# Patient Record
Sex: Female | Born: 1975 | Race: Black or African American | Hispanic: No | Marital: Married | Smoking: Never smoker
Health system: Southern US, Community
[De-identification: ages and names within clinical notes are randomized; demographics above are authoritative.]

## PROBLEM LIST (undated history)

## (undated) DIAGNOSIS — O24419 Gestational diabetes mellitus in pregnancy, unspecified control: Secondary | ICD-10-CM

## (undated) HISTORY — DX: Gestational diabetes mellitus in pregnancy, unspecified control: O24.419

---

## 1997-12-29 ENCOUNTER — Emergency Department (HOSPITAL_COMMUNITY): Admission: EM | Admit: 1997-12-29 | Discharge: 1997-12-29 | Payer: Self-pay | Admitting: Emergency Medicine

## 1999-05-18 ENCOUNTER — Other Ambulatory Visit: Admission: RE | Admit: 1999-05-18 | Discharge: 1999-05-18 | Payer: Self-pay | Admitting: Internal Medicine

## 2000-07-31 ENCOUNTER — Other Ambulatory Visit: Admission: RE | Admit: 2000-07-31 | Discharge: 2000-07-31 | Payer: Self-pay | Admitting: Obstetrics and Gynecology

## 2000-08-15 ENCOUNTER — Ambulatory Visit (HOSPITAL_COMMUNITY): Admission: RE | Admit: 2000-08-15 | Discharge: 2000-08-15 | Payer: Self-pay | Admitting: Obstetrics and Gynecology

## 2000-08-15 ENCOUNTER — Encounter: Payer: Self-pay | Admitting: Obstetrics and Gynecology

## 2000-12-04 ENCOUNTER — Encounter: Payer: Self-pay | Admitting: Obstetrics and Gynecology

## 2000-12-04 ENCOUNTER — Ambulatory Visit (HOSPITAL_COMMUNITY): Admission: RE | Admit: 2000-12-04 | Discharge: 2000-12-04 | Payer: Self-pay | Admitting: Obstetrics and Gynecology

## 2000-12-21 ENCOUNTER — Inpatient Hospital Stay (HOSPITAL_COMMUNITY): Admission: AD | Admit: 2000-12-21 | Discharge: 2000-12-23 | Payer: Self-pay | Admitting: Obstetrics and Gynecology

## 2001-07-16 ENCOUNTER — Other Ambulatory Visit: Admission: RE | Admit: 2001-07-16 | Discharge: 2001-07-16 | Payer: Self-pay | Admitting: Obstetrics and Gynecology

## 2003-02-17 ENCOUNTER — Other Ambulatory Visit: Admission: RE | Admit: 2003-02-17 | Discharge: 2003-02-17 | Payer: Self-pay | Admitting: Obstetrics and Gynecology

## 2004-10-03 ENCOUNTER — Other Ambulatory Visit: Admission: RE | Admit: 2004-10-03 | Discharge: 2004-10-03 | Payer: Self-pay | Admitting: Obstetrics and Gynecology

## 2006-02-14 ENCOUNTER — Emergency Department (HOSPITAL_COMMUNITY): Admission: EM | Admit: 2006-02-14 | Discharge: 2006-02-14 | Payer: Self-pay | Admitting: Emergency Medicine

## 2011-02-20 ENCOUNTER — Ambulatory Visit: Payer: Self-pay

## 2011-02-20 DIAGNOSIS — Z0289 Encounter for other administrative examinations: Secondary | ICD-10-CM

## 2011-02-22 ENCOUNTER — Encounter (INDEPENDENT_AMBULATORY_CARE_PROVIDER_SITE_OTHER): Payer: Self-pay

## 2011-02-22 DIAGNOSIS — Z111 Encounter for screening for respiratory tuberculosis: Secondary | ICD-10-CM

## 2012-10-29 ENCOUNTER — Ambulatory Visit: Payer: BC Managed Care – PPO | Admitting: Physician Assistant

## 2012-10-29 VITALS — BP 106/76 | HR 96 | Temp 99.5°F | Resp 20 | Ht 62.5 in | Wt 137.2 lb

## 2012-10-29 DIAGNOSIS — R069 Unspecified abnormalities of breathing: Secondary | ICD-10-CM

## 2012-10-29 DIAGNOSIS — R05 Cough: Secondary | ICD-10-CM

## 2012-10-29 MED ORDER — MUCINEX DM MAXIMUM STRENGTH 60-1200 MG PO TB12: 1.0000 | ORAL_TABLET | Freq: Two times a day (BID) | ORAL | Status: DC

## 2012-10-29 MED ORDER — HYDROCOD POLST-CHLORPHEN POLST 10-8 MG/5ML PO LQCR: 5.0000 mL | Freq: Two times a day (BID) | ORAL | Status: AC

## 2012-10-29 NOTE — Progress Notes (Signed)
   [address], [place] [address]   Phone [phone]  Subjective:    Patient ID: Alexa [name], female    DOB: Jul 28, 1975, 37 y.o.   MRN: [id number]  HPI Pt presents to clinic with about 1 wk h/o cold symptoms.  Her congestion has improved she thinks she might have some PND but no rhinorrhea.  Her cough is dry and feels like a tickle in her throat and once it starts she cannot get it to stop.  She is having some chest wall pain related to the cough. The cough is disrupting her sleep.  OTC meds -  Mucinex, zyrtec Sick contacts-none nonsmoker Review of Systems  Constitutional: Negative for fever and chills.  HENT: Positive for sore throat and postnasal drip. Negative for congestion and rhinorrhea.   Respiratory: Positive for cough (mostly dry until small amount of green this am which has not happened again), chest tightness (chest wall pain) and shortness of breath (with coughing spells). Negative for wheezing (no h/o asthma).   Musculoskeletal: Negative for myalgias.  Neurological: Positive for headaches.  Psychiatric/Behavioral: Positive for sleep disturbance (2nd to cough).       Objective:   Physical Exam  Vitals reviewed. Constitutional: She appears well-developed and well-nourished.  HENT:  Head: Normocephalic and atraumatic.  Right Ear: Hearing, tympanic membrane, external ear and ear canal normal.  Left Ear: Hearing, tympanic membrane, external ear and ear canal normal.  Nose: Mucosal edema (red and swollen) present.  Mouth/Throat: Uvula is midline, oropharynx is clear and moist and mucous membranes are normal.  Eyes: Conjunctivae are normal.  Neck: Normal range of motion.  Cardiovascular: Normal rate, regular rhythm and normal heart sounds.   No murmur heard. Pulmonary/Chest: Effort normal and breath sounds normal. No respiratory distress.  Lymphadenopathy:    She has no cervical adenopathy.       Assessment & Plan:  Cough - Plan:  chlorpheniramine-HYDROcodone (TUSSIONEX PENNKINETIC ER) 10-8 MG/5ML LQCR, Dextromethorphan-Guaifenesin (MUCINEX DM MAXIMUM STRENGTH) 60-1200 MG TB12  Pt is suffering from a post-viral inflammatory cough and if she is able to control the cough I think that she will fee better.  I think with the congestion that is seen on exam she has a component of PND and treating with mucinex will help improve that.  Benny Lennert PA-C 10/29/2012 12:27 PM

## 2012-11-19 ENCOUNTER — Ambulatory Visit: Payer: BC Managed Care – PPO

## 2012-11-19 ENCOUNTER — Ambulatory Visit: Payer: BC Managed Care – PPO | Admitting: Family Medicine

## 2012-11-19 VITALS — BP 110/70 | HR 85 | Temp 98.7°F | Resp 16 | Ht 62.5 in | Wt 135.6 lb

## 2012-11-19 DIAGNOSIS — R05 Cough: Secondary | ICD-10-CM

## 2012-11-19 DIAGNOSIS — J189 Pneumonia, unspecified organism: Secondary | ICD-10-CM

## 2012-11-19 DIAGNOSIS — R059 Cough, unspecified: Secondary | ICD-10-CM

## 2012-11-19 MED ORDER — AZITHROMYCIN 250 MG PO TABS: ORAL_TABLET | ORAL | Status: DC

## 2012-11-19 MED ORDER — HYDROCOD POLST-CHLORPHEN POLST 10-8 MG/5ML PO LQCR: 5.0000 mL | Freq: Every evening | ORAL | Status: DC | PRN

## 2012-11-19 MED ORDER — BENZONATATE 100 MG PO CAPS: 100.0000 mg | ORAL_CAPSULE | Freq: Three times a day (TID) | ORAL | Status: DC | PRN

## 2012-11-19 NOTE — Progress Notes (Signed)
Subjective:    Patient ID: Alexa [name], female    DOB: 02-Sep-1975, 37 y.o.   MRN: [id number]  This chart was scribed for Meredith Staggers, MD by Greggory Stallion, ED Scribe. This patient's care was started at 1:26 PM.  HPI HPI Comments: Alexa [name] is a 37 y.o. female who presents to the office complaining of cough.  Seen 10/29/12 with h/o 1 week of cold symptoms. Persistent cough at the time. Suspected postviral inflammatory cough. Treated with Mucinex DM and Tussionex.   Pt is still having productive cough of green sputum with only little relief from the medications she was given 1.5 weeks ago. tussionex helped until ran out. She has also tried Delsym with min relief. She also has congestion and rhinorrhea, green in color, that started 1 week ago. She denies fever. Pt denies history of asthma.    History reviewed. No pertinent past medical history. History reviewed. No pertinent past surgical history. No Known Allergies Prior to Admission medications   Medication Sig Start Date End Date Taking? Authorizing Provider  Dextromethorphan-Guaifenesin (MUCINEX DM MAXIMUM STRENGTH) 60-1200 MG TB12 Take 1 tablet by mouth 2 (two) times daily. 10/29/12  Yes Morrell Riddle, PA-C   History   Social History  . Marital Status: Married    Spouse Name: N/A    Number of Children: N/A  . Years of Education: N/A   Occupational History  . Not on file.   Social History Main Topics  . Smoking status: Never Smoker   . Smokeless tobacco: Not on file  . Alcohol Use: No  . Drug Use: No  . Sexual Activity: Yes    Partners: Male   Other Topics Concern  . Not on file   Social History Narrative  . No narrative on file      Review of Systems  Constitutional: Negative for fever.  HENT: Positive for congestion and rhinorrhea.   Respiratory: Positive for cough.        Objective:   Physical Exam  Constitutional: She is oriented to person, place, and time. She appears well-developed and  well-nourished. No distress.  HENT:  Head: Normocephalic and atraumatic.  Right Ear: Hearing, tympanic membrane, external ear and ear canal normal.  Left Ear: Hearing, tympanic membrane, external ear and ear canal normal.  Nose: Nose normal. Right sinus exhibits no maxillary sinus tenderness and no frontal sinus tenderness. Left sinus exhibits no maxillary sinus tenderness and no frontal sinus tenderness.  Mouth/Throat: Oropharynx is clear and moist. No oropharyngeal exudate.  Eyes: Conjunctivae and EOM are normal. Pupils are equal, round, and reactive to light.  Neck:  No lymphadenopathy.   Cardiovascular: Normal rate, regular rhythm, normal heart sounds and intact distal pulses.   No murmur heard. Pulmonary/Chest: Effort normal. No stridor. No respiratory distress. She has no wheezes. She has rhonchi (left lower lobe).  A few coarse breath sounds in left greater than right lower lobe  Neurological: She is alert and oriented to person, place, and time.  Skin: Skin is warm and dry. No rash noted.  Psychiatric: She has a normal mood and affect. Her behavior is normal.    Filed Vitals:   11/19/12 1306  BP: 110/70  Pulse: 85  Temp: 98.7 F (37.1 C)  TempSrc: Oral  Resp: 16  Height: 5' 2.5" (1.588 m)  Weight: 135 lb 9.6 oz (61.508 kg)  SpO2: 99%   [facility] reading (PRIMARY) by  Dr. Neva Seat: CXR: few increased RML markings without discrete infiltrate.      Marland Kitchen  Assessment & Plan:   Alexa [name] is a 37 y.o. female Cough - Plan: DG Chest 2 View, chlorpheniramine-HYDROcodone (TUSSIONEX PENNKINETIC ER) 10-8 MG/5ML LQCR  CAP (community acquired pneumonia) - Plan: azithromycin (ZITHROMAX) 250 MG tablet   Persistent now discolored/productive cough. Early CAP vs bronchitis. mucinex or mucinex DM or tessalon during the day.  Start Zpak, tussionex qhs prn. rtc precautions.  Meds ordered this encounter  Medications  . azithromycin (ZITHROMAX) 250 MG tablet    Sig: Take 2 pills by mouth on  day 1, then 1 pill by mouth per day on days 2 through 5.    Dispense:  6 each    Refill:  0  . chlorpheniramine-HYDROcodone (TUSSIONEX PENNKINETIC ER) 10-8 MG/5ML LQCR    Sig: Take 5 mLs by mouth at bedtime as needed.    Dispense:  480 mL    Refill:  0   Patient Instructions  Start antibiotic for possible early pneumonia or bronchitis. mucinex during the day, or tessalon if needed during the day for cough, tussionex if needed at night. Return to the clinic or go to the nearest emergency room if any of your symptoms worsen or new symptoms occur. Pneumonia, Adult Pneumonia is an infection of the lungs.  CAUSES Pneumonia may be caused by bacteria or a virus. Usually, these infections are caused by breathing infectious particles into the lungs (respiratory tract). SYMPTOMS   Cough.  Fever.  Chest pain.  Increased rate of breathing.  Wheezing.  Mucus production. DIAGNOSIS  If you have the common symptoms of pneumonia, your caregiver will typically confirm the diagnosis with a chest X-ray. The X-ray will show an abnormality in the lung (pulmonary infiltrate) if you have pneumonia. Other tests of your blood, urine, or sputum may be done to find the specific cause of your pneumonia. Your caregiver may also do tests (blood gases or pulse oximetry) to see how well your lungs are working. TREATMENT  Some forms of pneumonia may be spread to other people when you cough or sneeze. You may be asked to wear a mask before and during your exam. Pneumonia that is caused by bacteria is treated with antibiotic medicine. Pneumonia that is caused by the influenza virus may be treated with an antiviral medicine. Most other viral infections must run their course. These infections will not respond to antibiotics.  PREVENTION A pneumococcal shot (vaccine) is available to prevent a common bacterial cause of pneumonia. This is usually suggested for:  People over 42 years old.  Patients on  chemotherapy.  People with chronic lung problems, such as bronchitis or emphysema.  People with immune system problems. If you are over 65 or have a high risk condition, you may receive the pneumococcal vaccine if you have not received it before. In some countries, a routine influenza vaccine is also recommended. This vaccine can help prevent some cases of pneumonia.You may be offered the influenza vaccine as part of your care. If you smoke, it is time to quit. You may receive instructions on how to stop smoking. Your caregiver can provide medicines and counseling to help you quit. HOME CARE INSTRUCTIONS   Cough suppressants may be used if you are losing too much rest. However, coughing protects you by clearing your lungs. You should avoid using cough suppressants if you can.  Your caregiver may have prescribed medicine if he or she thinks your pneumonia is caused by a bacteria or influenza. Finish your medicine even if you start to feel better.  Your caregiver may also prescribe an expectorant. This loosens the mucus to be coughed up.  Only take over-the-counter or prescription medicines for pain, discomfort, or fever as directed by your caregiver.  Do not smoke. Smoking is a common cause of bronchitis and can contribute to pneumonia. If you are a smoker and continue to smoke, your cough may last several weeks after your pneumonia has cleared.  A cold steam vaporizer or humidifier in your room or home may help loosen mucus.  Coughing is often worse at night. Sleeping in a semi-upright position in a recliner or using a couple pillows under your head will help with this.  Get rest as you feel it is needed. Your body will usually let you know when you need to rest. SEEK IMMEDIATE MEDICAL CARE IF:   Your illness becomes worse. This is especially true if you are elderly or weakened from any other disease.  You cannot control your cough with suppressants and are losing sleep.  You begin  coughing up blood.  You develop pain which is getting worse or is uncontrolled with medicines.  You have a fever.  Any of the symptoms which initially brought you in for treatment are getting worse rather than better.  You develop shortness of breath or chest pain. MAKE SURE YOU:   Understand these instructions.  Will watch your condition.  Will get help right away if you are not doing well or get worse. Document Released: 01/23/2005 Document Revised: 04/17/2011 Document Reviewed: 04/14/2010 [facility] Patient Information 2014 [place], [place]. Cough, Adult  A cough is a reflex that helps clear your throat and airways. It can help heal the body or may be a reaction to an irritated airway. A cough may only last 2 or 3 weeks (acute) or may last more than 8 weeks (chronic).  CAUSES Acute cough:  Viral or bacterial infections. Chronic cough:  Infections.  Allergies.  Asthma.  Post-nasal drip.  Smoking.  Heartburn or acid reflux.  Some medicines.  Chronic lung problems (COPD).  Cancer. SYMPTOMS   Cough.  Fever.  Chest pain.  Increased breathing rate.  High-pitched whistling sound when breathing (wheezing).  Colored mucus that you cough up (sputum). TREATMENT   A bacterial cough may be treated with antibiotic medicine.  A viral cough must run its course and will not respond to antibiotics.  Your caregiver may recommend other treatments if you have a chronic cough. HOME CARE INSTRUCTIONS   Only take over-the-counter or prescription medicines for pain, discomfort, or fever as directed by your caregiver. Use cough suppressants only as directed by your caregiver.  Use a cold steam vaporizer or humidifier in your bedroom or home to help loosen secretions.  Sleep in a semi-upright position if your cough is worse at night.  Rest as needed.  Stop smoking if you smoke. SEEK IMMEDIATE MEDICAL CARE IF:   You have pus in your sputum.  Your cough starts to  worsen.  You cannot control your cough with suppressants and are losing sleep.  You begin coughing up blood.  You have difficulty breathing.  You develop pain which is getting worse or is uncontrolled with medicine.  You have a fever. MAKE SURE YOU:   Understand these instructions.  Will watch your condition.  Will get help right away if you are not doing well or get worse. Document Released: 07/22/2010 Document Revised: 04/17/2011 Document Reviewed: 07/22/2010 [facility] Patient Information 2014 [place], [place].

## 2012-11-19 NOTE — Patient Instructions (Signed)
Start antibiotic for possible early pneumonia or bronchitis. mucinex during the day, or tessalon if needed during the day for cough, tussionex if needed at night. Return to the clinic or go to the nearest emergency room if any of your symptoms worsen or new symptoms occur. Pneumonia, Adult Pneumonia is an infection of the lungs.  CAUSES Pneumonia may be caused by bacteria or a virus. Usually, these infections are caused by breathing infectious particles into the lungs (respiratory tract). SYMPTOMS   Cough.  Fever.  Chest pain.  Increased rate of breathing.  Wheezing.  Mucus production. DIAGNOSIS  If you have the common symptoms of pneumonia, your caregiver will typically confirm the diagnosis with a chest X-ray. The X-ray will show an abnormality in the lung (pulmonary infiltrate) if you have pneumonia. Other tests of your blood, urine, or sputum may be done to find the specific cause of your pneumonia. Your caregiver may also do tests (blood gases or pulse oximetry) to see how well your lungs are working. TREATMENT  Some forms of pneumonia may be spread to other people when you cough or sneeze. You may be asked to wear a mask before and during your exam. Pneumonia that is caused by bacteria is treated with antibiotic medicine. Pneumonia that is caused by the influenza virus may be treated with an antiviral medicine. Most other viral infections must run their course. These infections will not respond to antibiotics.  PREVENTION A pneumococcal shot (vaccine) is available to prevent a common bacterial cause of pneumonia. This is usually suggested for:  People over 67 years old.  Patients on chemotherapy.  People with chronic lung problems, such as bronchitis or emphysema.  People with immune system problems. If you are over 65 or have a high risk condition, you may receive the pneumococcal vaccine if you have not received it before. In some countries, a routine influenza vaccine is also  recommended. This vaccine can help prevent some cases of pneumonia.You may be offered the influenza vaccine as part of your care. If you smoke, it is time to quit. You may receive instructions on how to stop smoking. Your caregiver can provide medicines and counseling to help you quit. HOME CARE INSTRUCTIONS   Cough suppressants may be used if you are losing too much rest. However, coughing protects you by clearing your lungs. You should avoid using cough suppressants if you can.  Your caregiver may have prescribed medicine if he or she thinks your pneumonia is caused by a bacteria or influenza. Finish your medicine even if you start to feel better.  Your caregiver may also prescribe an expectorant. This loosens the mucus to be coughed up.  Only take over-the-counter or prescription medicines for pain, discomfort, or fever as directed by your caregiver.  Do not smoke. Smoking is a common cause of bronchitis and can contribute to pneumonia. If you are a smoker and continue to smoke, your cough may last several weeks after your pneumonia has cleared.  A cold steam vaporizer or humidifier in your room or home may help loosen mucus.  Coughing is often worse at night. Sleeping in a semi-upright position in a recliner or using a couple pillows under your head will help with this.  Get rest as you feel it is needed. Your body will usually let you know when you need to rest. SEEK IMMEDIATE MEDICAL CARE IF:   Your illness becomes worse. This is especially true if you are elderly or weakened from any other disease.  You cannot control your cough with suppressants and are losing sleep.  You begin coughing up blood.  You develop pain which is getting worse or is uncontrolled with medicines.  You have a fever.  Any of the symptoms which initially brought you in for treatment are getting worse rather than better.  You develop shortness of breath or chest pain. MAKE SURE YOU:   Understand these  instructions.  Will watch your condition.  Will get help right away if you are not doing well or get worse. Document Released: 01/23/2005 Document Revised: 04/17/2011 Document Reviewed: 04/14/2010 [facility] Patient Information 2014 [place], [place]. Cough, Adult  A cough is a reflex that helps clear your throat and airways. It can help heal the body or may be a reaction to an irritated airway. A cough may only last 2 or 3 weeks (acute) or may last more than 8 weeks (chronic).  CAUSES Acute cough:  Viral or bacterial infections. Chronic cough:  Infections.  Allergies.  Asthma.  Post-nasal drip.  Smoking.  Heartburn or acid reflux.  Some medicines.  Chronic lung problems (COPD).  Cancer. SYMPTOMS   Cough.  Fever.  Chest pain.  Increased breathing rate.  High-pitched whistling sound when breathing (wheezing).  Colored mucus that you cough up (sputum). TREATMENT   A bacterial cough may be treated with antibiotic medicine.  A viral cough must run its course and will not respond to antibiotics.  Your caregiver may recommend other treatments if you have a chronic cough. HOME CARE INSTRUCTIONS   Only take over-the-counter or prescription medicines for pain, discomfort, or fever as directed by your caregiver. Use cough suppressants only as directed by your caregiver.  Use a cold steam vaporizer or humidifier in your bedroom or home to help loosen secretions.  Sleep in a semi-upright position if your cough is worse at night.  Rest as needed.  Stop smoking if you smoke. SEEK IMMEDIATE MEDICAL CARE IF:   You have pus in your sputum.  Your cough starts to worsen.  You cannot control your cough with suppressants and are losing sleep.  You begin coughing up blood.  You have difficulty breathing.  You develop pain which is getting worse or is uncontrolled with medicine.  You have a fever. MAKE SURE YOU:   Understand these instructions.  Will watch your  condition.  Will get help right away if you are not doing well or get worse. Document Released: 07/22/2010 Document Revised: 04/17/2011 Document Reviewed: 07/22/2010 [facility] Patient Information 2014 [place], [place].

## 2014-02-06 NOTE — L&D Delivery Note (Signed)
Delivery Note Pt progressed rapidly to complete dilation and pushed great for about 5 minutes.  At 2:43 PM a healthy female was delivered via  (Presentation:LOA).  APGAR:8 ,9 ; weight  pending.   Placenta status: delivered spontaneously.  Cord:  with the following complications:short .    Anesthesia:  epidural Episiotomy:  none Lacerations: none Suture Repair:  none Est. Blood Loss (mL):  200cc  Mom to postpartum.  Baby to Couplet care / Skin to Skin. D/w pt circumcision, pt plans in the office  Anabia Weatherwax W 08/25/2014, 2:57 PM

## 2014-04-07 LAB — OB RESULTS CONSOLE HEPATITIS B SURFACE ANTIGEN: Hepatitis B Surface Ag: NEGATIVE

## 2014-04-07 LAB — OB RESULTS CONSOLE PLATELET COUNT: PLATELETS: 261 10*3/uL

## 2014-04-07 LAB — OB RESULTS CONSOLE ABO/RH: RH TYPE: POSITIVE

## 2014-04-07 LAB — OB RESULTS CONSOLE ANTIBODY SCREEN: ANTIBODY SCREEN: NEGATIVE

## 2014-04-07 LAB — OB RESULTS CONSOLE RPR: RPR: NONREACTIVE

## 2014-04-07 LAB — OB RESULTS CONSOLE HGB/HCT, BLOOD
HCT: 36 %
Hemoglobin: 11.7 g/dL

## 2014-04-07 LAB — OB RESULTS CONSOLE RUBELLA ANTIBODY, IGM: RUBELLA: IMMUNE

## 2014-04-07 LAB — OB RESULTS CONSOLE HIV ANTIBODY (ROUTINE TESTING): HIV: NONREACTIVE

## 2014-06-24 ENCOUNTER — Encounter: Payer: Medicaid Other | Attending: Obstetrics and Gynecology

## 2014-06-24 VITALS — Ht 62.0 in | Wt 165.3 lb

## 2014-06-24 DIAGNOSIS — O24419 Gestational diabetes mellitus in pregnancy, unspecified control: Secondary | ICD-10-CM | POA: Insufficient documentation

## 2014-06-24 DIAGNOSIS — Z713 Dietary counseling and surveillance: Secondary | ICD-10-CM | POA: Insufficient documentation

## 2014-06-25 NOTE — Progress Notes (Signed)
  Patient was seen on 06/24/14 for Gestational Diabetes self-management . The following learning objectives were met by the patient :   States the definition of Gestational Diabetes  States why dietary management is important in controlling blood glucose  Describes the effects of carbohydrates on blood glucose levels  Demonstrates ability to create a balanced meal plan  Demonstrates carbohydrate counting   States when to check blood glucose levels  Demonstrates proper blood glucose monitoring techniques  States the effect of stress and exercise on blood glucose levels  States the importance of limiting caffeine and abstaining from alcohol and smoking  Plan:  Aim for 2 Carb Choices per meal (30 grams) +/- 1 either way for breakfast Aim for 3 Carb Choices per meal (45 grams) +/- 1 either way from lunch and dinner Aim for 1-2 Carbs per snack Begin reading food labels for Total Carbohydrate and sugar grams of foods Consider  increasing your activity level by walking daily as tolerated Begin checking BG before breakfast and 1hours after first bit of breakfast, lunch and dinner after  as directed by MD  Take medication  as directed by MD  Blood glucose monitor given: Accu Chek Nano BG Monitoring Kit Lot # [id number] Exp: 07/07/15 Blood glucose reading: $RemoveBeforeDE'89mg'OGMelQgmMDPcQia$ /dl  Patient instructed to monitor glucose levels: FBS: 60 - <90 1 hour: <140  Patient received the following handouts:  Nutrition Diabetes and Pregnancy  Carbohydrate Counting List  Meal Planning worksheet  Patient will be seen for follow-up as needed.

## 2014-08-07 LAB — OB RESULTS CONSOLE GBS: GBS: NEGATIVE

## 2014-08-25 ENCOUNTER — Inpatient Hospital Stay (HOSPITAL_COMMUNITY): Payer: Medicaid Other | Admitting: Anesthesiology

## 2014-08-25 ENCOUNTER — Inpatient Hospital Stay (HOSPITAL_COMMUNITY)
Admission: AD | Admit: 2014-08-25 | Discharge: 2014-08-27 | DRG: 775 | Disposition: A | Discharge status: Home / Self Care | Payer: Medicaid Other | Source: Ambulatory Visit | Attending: Obstetrics and Gynecology | Admitting: Obstetrics and Gynecology

## 2014-08-25 ENCOUNTER — Encounter (HOSPITAL_COMMUNITY): Payer: Self-pay | Admitting: *Deleted

## 2014-08-25 DIAGNOSIS — I9589 Other hypotension: Secondary | ICD-10-CM | POA: Diagnosis present

## 2014-08-25 DIAGNOSIS — Z3A38 38 weeks gestation of pregnancy: Secondary | ICD-10-CM | POA: Diagnosis present

## 2014-08-25 DIAGNOSIS — O4202 Full-term premature rupture of membranes, onset of labor within 24 hours of rupture: Principal | ICD-10-CM | POA: Diagnosis present

## 2014-08-25 DIAGNOSIS — IMO0001 Reserved for inherently not codable concepts without codable children: Secondary | ICD-10-CM

## 2014-08-25 LAB — CBC
HEMATOCRIT: 36.7 % (ref 36.0–46.0)
Hemoglobin: 12.1 g/dL (ref 12.0–15.0)
MCH: 31.5 pg (ref 26.0–34.0)
MCHC: 33 g/dL (ref 30.0–36.0)
MCV: 95.6 fL (ref 78.0–100.0)
Platelets: 254 10*3/uL (ref 150–400)
RBC: 3.84 MIL/uL — AB (ref 3.87–5.11)
RDW: 14.5 % (ref 11.5–15.5)
WBC: 12.3 10*3/uL — ABNORMAL HIGH (ref 4.0–10.5)

## 2014-08-25 LAB — ABO/RH: ABO/RH(D): O POS

## 2014-08-25 LAB — TYPE AND SCREEN
ABO/RH(D): O POS
ANTIBODY SCREEN: NEGATIVE

## 2014-08-25 MED ORDER — DIBUCAINE 1 % RE OINT
1.0000 "application " | TOPICAL_OINTMENT | RECTAL | Status: DC | PRN
  Filled 2014-08-25: qty 28

## 2014-08-25 MED ORDER — LIDOCAINE HCL (PF) 1 % IJ SOLN
INTRAMUSCULAR | Status: DC | PRN
  Administered 2014-08-25 (×2): 4 mL

## 2014-08-25 MED ORDER — TERBUTALINE SULFATE 1 MG/ML IJ SOLN
0.2500 mg | Freq: Once | INTRAMUSCULAR | Status: DC | PRN
  Filled 2014-08-25: qty 1

## 2014-08-25 MED ORDER — PHENYLEPHRINE 40 MCG/ML (10ML) SYRINGE FOR IV PUSH (FOR BLOOD PRESSURE SUPPORT)
PREFILLED_SYRINGE | INTRAVENOUS | Status: AC
  Filled 2014-08-25: qty 20

## 2014-08-25 MED ORDER — LACTATED RINGERS IV SOLN
INTRAVENOUS | Status: DC
  Administered 2014-08-25: 10:00:00 via INTRAVENOUS

## 2014-08-25 MED ORDER — CITRIC ACID-SODIUM CITRATE 334-500 MG/5ML PO SOLN
30.0000 mL | ORAL | Status: DC | PRN
  Filled 2014-08-25: qty 15

## 2014-08-25 MED ORDER — ONDANSETRON HCL 4 MG/2ML IJ SOLN: 4.0000 mg | Freq: Four times a day (QID) | INTRAMUSCULAR | Status: DC | PRN

## 2014-08-25 MED ORDER — SENNOSIDES-DOCUSATE SODIUM 8.6-50 MG PO TABS
2.0000 | ORAL_TABLET | ORAL | Status: DC
  Administered 2014-08-26 (×2): 2 via ORAL
  Filled 2014-08-25 (×2): qty 2

## 2014-08-25 MED ORDER — PHENYLEPHRINE 40 MCG/ML (10ML) SYRINGE FOR IV PUSH (FOR BLOOD PRESSURE SUPPORT)
80.0000 ug | PREFILLED_SYRINGE | INTRAVENOUS | Status: AC | PRN
  Administered 2014-08-25 (×3): 80 ug via INTRAVENOUS

## 2014-08-25 MED ORDER — LANOLIN HYDROUS EX OINT
TOPICAL_OINTMENT | CUTANEOUS | Status: DC | PRN
Start: 2014-08-25 — End: 2014-08-27

## 2014-08-25 MED ORDER — OXYCODONE-ACETAMINOPHEN 5-325 MG PO TABS: 2.0000 | ORAL_TABLET | ORAL | Status: DC | PRN

## 2014-08-25 MED ORDER — OXYCODONE-ACETAMINOPHEN 5-325 MG PO TABS: 1.0000 | ORAL_TABLET | ORAL | Status: DC | PRN

## 2014-08-25 MED ORDER — ONDANSETRON HCL 4 MG PO TABS: 4.0000 mg | ORAL_TABLET | ORAL | Status: DC | PRN

## 2014-08-25 MED ORDER — OXYTOCIN 40 UNITS IN LACTATED RINGERS INFUSION - SIMPLE MED
1.0000 m[IU]/min | INTRAVENOUS | Status: DC
  Administered 2014-08-25: 2 m[IU]/min via INTRAVENOUS
  Filled 2014-08-25: qty 1000

## 2014-08-25 MED ORDER — BENZOCAINE-MENTHOL 20-0.5 % EX AERO
1.0000 "application " | INHALATION_SPRAY | CUTANEOUS | Status: DC | PRN
  Filled 2014-08-25: qty 56

## 2014-08-25 MED ORDER — FENTANYL 2.5 MCG/ML BUPIVACAINE 1/10 % EPIDURAL INFUSION (WH - ANES)
INTRAMUSCULAR | Status: AC
  Filled 2014-08-25: qty 125

## 2014-08-25 MED ORDER — FENTANYL 2.5 MCG/ML BUPIVACAINE 1/10 % EPIDURAL INFUSION (WH - ANES)
14.0000 mL/h | INTRAMUSCULAR | Status: DC | PRN
  Administered 2014-08-25 (×2): 14 mL/h via EPIDURAL

## 2014-08-25 MED ORDER — ACETAMINOPHEN 325 MG PO TABS: 650.0000 mg | ORAL_TABLET | ORAL | Status: DC | PRN

## 2014-08-25 MED ORDER — ONDANSETRON HCL 4 MG/2ML IJ SOLN
4.0000 mg | INTRAMUSCULAR | Status: DC | PRN
Start: 2014-08-25 — End: 2014-08-27

## 2014-08-25 MED ORDER — TETANUS-DIPHTH-ACELL PERTUSSIS 5-2.5-18.5 LF-MCG/0.5 IM SUSP
0.5000 mL | Freq: Once | INTRAMUSCULAR | Status: DC
  Filled 2014-08-25: qty 0.5

## 2014-08-25 MED ORDER — OXYCODONE-ACETAMINOPHEN 5-325 MG PO TABS
1.0000 | ORAL_TABLET | ORAL | Status: DC | PRN
  Administered 2014-08-26 – 2014-08-27 (×5): 1 via ORAL
  Filled 2014-08-25 (×6): qty 1

## 2014-08-25 MED ORDER — LIDOCAINE HCL (PF) 1 % IJ SOLN: 30.0000 mL | INTRAMUSCULAR | Status: DC | PRN

## 2014-08-25 MED ORDER — DIPHENHYDRAMINE HCL 25 MG PO CAPS: 25.0000 mg | ORAL_CAPSULE | Freq: Four times a day (QID) | ORAL | Status: DC | PRN

## 2014-08-25 MED ORDER — PRENATAL MULTIVITAMIN CH
1.0000 | ORAL_TABLET | Freq: Every day | ORAL | Status: DC
  Administered 2014-08-26 – 2014-08-27 (×2): 1 via ORAL
  Filled 2014-08-25 (×2): qty 1

## 2014-08-25 MED ORDER — IBUPROFEN 600 MG PO TABS
600.0000 mg | ORAL_TABLET | Freq: Four times a day (QID) | ORAL | Status: DC
  Administered 2014-08-25 – 2014-08-27 (×8): 600 mg via ORAL
  Filled 2014-08-25 (×8): qty 1

## 2014-08-25 MED ORDER — OXYTOCIN BOLUS FROM INFUSION
500.0000 mL | INTRAVENOUS | Status: DC
  Administered 2014-08-25: 500 mL via INTRAVENOUS

## 2014-08-25 MED ORDER — DIPHENHYDRAMINE HCL 50 MG/ML IJ SOLN: 12.5000 mg | INTRAMUSCULAR | Status: DC | PRN

## 2014-08-25 MED ORDER — OXYTOCIN 40 UNITS IN LACTATED RINGERS INFUSION - SIMPLE MED
62.5000 mL/h | INTRAVENOUS | Status: DC
  Administered 2014-08-25: 62.5 mL/h via INTRAVENOUS

## 2014-08-25 MED ORDER — LACTATED RINGERS IV SOLN: 500.0000 mL | INTRAVENOUS | Status: DC | PRN

## 2014-08-25 MED ORDER — BUTORPHANOL TARTRATE 1 MG/ML IJ SOLN: 1.0000 mg | INTRAMUSCULAR | Status: DC | PRN

## 2014-08-25 MED ORDER — WITCH HAZEL-GLYCERIN EX PADS: 1.0000 "application " | MEDICATED_PAD | CUTANEOUS | Status: DC | PRN

## 2014-08-25 MED ORDER — SIMETHICONE 80 MG PO CHEW: 80.0000 mg | CHEWABLE_TABLET | ORAL | Status: DC | PRN

## 2014-08-25 MED ORDER — ZOLPIDEM TARTRATE 5 MG PO TABS: 5.0000 mg | ORAL_TABLET | Freq: Every evening | ORAL | Status: DC | PRN

## 2014-08-25 MED ORDER — EPHEDRINE 5 MG/ML INJ
10.0000 mg | INTRAVENOUS | Status: DC | PRN
Start: 2014-08-25 — End: 2014-08-25

## 2014-08-25 NOTE — Anesthesia Preprocedure Evaluation (Signed)
Anesthesia Evaluation  Patient identified by MRN, date of birth, ID band Patient awake    Reviewed: Allergy & Precautions, H&P , NPO status , Patient's Chart, lab work & pertinent test results, reviewed documented beta blocker date and time   Airway Mallampati: II  TM Distance: >3 FB Neck ROM: full    Dental no notable dental hx.    Pulmonary neg pulmonary ROS,  breath sounds clear to auscultation  Pulmonary exam normal       Cardiovascular negative cardio ROS Normal cardiovascular examRhythm:regular Rate:Normal     Neuro/Psych negative neurological ROS  negative psych ROS   GI/Hepatic negative GI ROS, Neg liver ROS,   Endo/Other  negative endocrine ROS  Renal/GU negative Renal ROS  negative genitourinary   Musculoskeletal   Abdominal (+) + obese,   Peds  Hematology negative hematology ROS (+)   Anesthesia Other Findings NPO appropriate, allergies reviewed Denies active cardiac or pulmonary symptoms, METS > 4 No recent congestive cough or symptoms of upper respiratory infection Meds - none    Reproductive/Obstetrics (+) Pregnancy                             Anesthesia Physical Anesthesia Plan  ASA: III  Anesthesia Plan: Epidural   Post-op Pain Management:    Induction:   Airway Management Planned:   Additional Equipment:   Intra-op Plan:   Post-operative Plan:   Informed Consent: I have reviewed the patients History and Physical, chart, labs and discussed the procedure including the risks, benefits and alternatives for the proposed anesthesia with the patient or authorized representative who has indicated his/her understanding and acceptance.     Plan Discussed with:   Anesthesia Plan Comments:         Anesthesia Quick Evaluation

## 2014-08-25 NOTE — MAU Note (Signed)
Pt reports contractions since yesterday, now 4 minutes apart.

## 2014-08-25 NOTE — H&P (Signed)
Antony OdeaRasheeda D Speciale is a 39 y.o. female [phone] at 38+ weeks (EDD 09/05/14 by 16 week US and unsure LMP) presenting for painful regular contractions and cervical change.  Cervix changed from 1-2cm to 4cm in MAU and pt had SROM.  Prenatal care significant for late start at 16 weeks and AMA.  She had a prior preterm delivery at 4136 6/7 weeks after SROM--d/w her 17-P and she declined.    Maternal Medical History:  Reason for admission: Contractions.   Contractions: Onset was 3-5 hours ago.   Frequency: regular.   Perceived severity is strong.    Fetal activity: Perceived fetal activity is normal.    Prenatal Complications - Diabetes: none.    OB History    Gravida Para Term Preterm AB TAB SAB Ectopic Multiple Living   3 2 2       2     1997 NSVD 6#10oz 2002 NSVD 4#13oz  History reviewed. No pertinent past medical history. Past Surgical History  Procedure Laterality Date  . Vaginal delivery     Family History: family history includes Asthma in her son; Cancer in her father. Social History:  reports that she has never smoked. She does not have any smokeless tobacco history on file. She reports that she does not drink alcohol or use illicit drugs.   Prenatal Transfer Tool  Maternal Diabetes: No Genetic Screening: Normal Maternal Ultrasounds/Referrals: Normal Fetal Ultrasounds or other Referrals:  None Maternal Substance Abuse:  No Significant Maternal Medications:  None Significant Maternal Lab Results:  None Other Comments:  None  Review of Systems  Gastrointestinal: Negative for abdominal pain.  Neurological: Negative for headaches.    Dilation: 4 (No cord felt) Effacement (%): 90 Station: -2 Exam by:: S. Carrera, RNC Blood pressure 104/60, pulse 88, temperature 97.7 F (36.5 C), temperature source Oral, resp. rate 20, height 5\' 2"  (1.575 m), weight 77.111 kg (170 lb), SpO2 100 %. Maternal Exam:  Uterine Assessment: Contraction strength is moderate.  Contraction frequency  is regular.   Abdomen: Fetal presentation: vertex  Introitus: Normal vulva. Normal vagina.  Amniotic fluid character: clear.     Physical Exam  Constitutional: She appears well-developed and well-nourished.  Cardiovascular: Normal rate and regular rhythm.   Respiratory: Effort normal and breath sounds normal.  GI: Soft.  Genitourinary: Vagina normal.  Neurological: She is alert.  Psychiatric: She has a normal mood and affect.    Prenatal labs: ABO, Rh: --/--/O POS (07/19 [id number]) Antibody: NEG (07/19 0955) Rubella: Immune (03/01 0000) RPR: Nonreactive (03/01 0000)  HBsAg: Negative (03/01 0000)  HIV: Non-reactive (03/01 0000)  GBS: Negative (07/01 0000)  One hour GCT 158 Three hour GTT WNL Hgb AA CF negative Tetra negative  Assessment/Plan: Pt admitted and received epidural--had decel immediately following epidural for about 10 minutes which required phenylepinephrine x 3 to recover.  Now recovered with some variable decels.  IUPC placed and FSE.  Will follow heart rate closely.   Oliver PilaICHARDSON,Mikaiah Stoffer W 08/25/2014, 12:28 PM

## 2014-08-25 NOTE — Anesthesia Procedure Notes (Signed)
Epidural Patient location during procedure: OB  Staffing Anesthesiologist: Ross Hefferan Performed by: anesthesiologist   Preanesthetic Checklist Completed: patient identified, site marked, surgical consent, pre-op evaluation, timeout performed, IV checked, risks and benefits discussed and monitors and equipment checked  Epidural Patient position: sitting Prep: site prepped and draped and DuraPrep Patient monitoring: continuous pulse ox and blood pressure Approach: midline Injection technique: LOR air  Needle:  Needle type: Tuohy  Needle gauge: 17 G Needle length: 9 cm and 9 Needle insertion depth: 5 cm cm Catheter type: closed end flexible Catheter size: 19 Gauge Catheter at skin depth: 9 cm Test dose: negative  Assessment Events: blood not aspirated, injection not painful, no injection resistance, negative IV test and no paresthesia

## 2014-08-25 NOTE — Progress Notes (Signed)
Patient ID: Antony Odeaasheeda D Resende, female   DOB: [birth year], 39 y.o.   MRN: [id number]  Called to patient's room due to bradycardia x 8 minutes.  Dr Senaida Oresichardson on way in.  SVE 4-5/90%-0.  Bradycardia due to hypotension.  FHT responded to scalp stimulation on SVE.  FHT returned to baseline.  Pt stable on my departure when Dr Senaida Oresichardson arrived to assume care.  Levie HeritageJacob J Jerre Vandrunen, DO 08/25/2014 12:14 PM

## 2014-08-26 LAB — CBC
HCT: 29.4 % — ABNORMAL LOW (ref 36.0–46.0)
Hemoglobin: 9.4 g/dL — ABNORMAL LOW (ref 12.0–15.0)
MCH: 30.5 pg (ref 26.0–34.0)
MCHC: 32 g/dL (ref 30.0–36.0)
MCV: 95.5 fL (ref 78.0–100.0)
Platelets: 233 10*3/uL (ref 150–400)
RBC: 3.08 MIL/uL — AB (ref 3.87–5.11)
RDW: 14.6 % (ref 11.5–15.5)
WBC: 16 10*3/uL — AB (ref 4.0–10.5)

## 2014-08-26 LAB — RPR: RPR Ser Ql: NONREACTIVE

## 2014-08-26 LAB — HIV ANTIBODY (ROUTINE TESTING W REFLEX): HIV Screen 4th Generation wRfx: NONREACTIVE

## 2014-08-26 NOTE — Anesthesia Postprocedure Evaluation (Signed)
Anesthesia Post Note  Patient: Alexa [name]  Procedure(s) Performed: * No procedures listed *  Anesthesia type: Epidural  Patient location: Mother/Baby  Post pain: Pain level controlled  Post assessment: Post-op Vital signs reviewed  Last Vitals:  Filed Vitals:   08/26/14 0603  BP: 117/71  Pulse: 93  Temp: 36.8 C  Resp: 18    Post vital signs: Reviewed  Level of consciousness:alert  Complications: No apparent anesthesia complications

## 2014-08-26 NOTE — Lactation Note (Signed)
This note was copied from the chart of Alexa [name] [place]. Lactation Consultation Note: Mom reports that baby just fed for about 15 min and is asleep in her lap at present. Has been giving some formula also. Reports that breasts are feeling heavier today. Asking about pumping- manual pump given with instructions Mom pumping as I left room. Obtaining whitish milk. Encouraged frequent breast feeding to soften breasts. No questions at present. To call for assist prn  Patient Name: Alexa [name] Mosher ZOXWR'UToday's Date: 08/26/2014 Reason for consult: Initial assessment   Maternal Data Formula Feeding for Exclusion: Yes Reason for exclusion: Mother's choice to formula and breast feed on admission Has patient been taught Hand Expression?: Yes Does the patient have breastfeeding experience prior to this delivery?: Yes  Feeding Feeding Type: Formula  LATCH Score/Interventions                      Lactation Tools Discussed/Used Pump Review: Setup, frequency, and cleaning Initiated by:: DW Date initiated:: 08/27/14   Consult Status      Pamelia HoitWeeks, Saanvika Vazques D 08/26/2014, 3:26 PM

## 2014-08-26 NOTE — Progress Notes (Signed)
UR chart review completed.  

## 2014-08-26 NOTE — Progress Notes (Signed)
Post Partum Day 1 Subjective: no complaints, up ad lib, voiding, tolerating PO and nl lochia, pain controlled  Objective: Blood pressure 117/71, pulse 93, temperature 98.2 F (36.8 C), temperature source Oral, resp. rate 18, height 5\' 2"  (1.575 m), weight 77.111 kg (170 lb), SpO2 100 %, unknown if currently breastfeeding.  Physical Exam:  General: alert and no distress Lochia: appropriate Uterine Fundus: firm   Recent Labs  08/25/14 1006 08/26/14 0543  HGB 12.1 9.4*  HCT 36.7 29.4*    Assessment/Plan: Plan for discharge tomorrow, Breastfeeding and Lactation consult.  Routine care.     LOS: 1 day   [name], Alexa [name] 08/26/2014, 8:09 AM

## 2014-08-27 MED ORDER — PRENATAL VITAMIN 27-0.8 MG PO TABS: 1.0000 | ORAL_TABLET | Freq: Every day | ORAL | Status: DC

## 2014-08-27 MED ORDER — IBUPROFEN 800 MG PO TABS: 800.0000 mg | ORAL_TABLET | Freq: Three times a day (TID) | ORAL | Status: DC | PRN

## 2014-08-27 MED ORDER — OXYCODONE-ACETAMINOPHEN 5-325 MG PO TABS: 1.0000 | ORAL_TABLET | Freq: Four times a day (QID) | ORAL | Status: DC | PRN

## 2014-08-27 NOTE — Lactation Note (Signed)
This note was copied from the chart of Alexa [name]. Lactation Consultation Note: Mom has just finished feeding baby - has been using NS because her "nipples were too big for the baby's mouth" Baby still rooting, offered assist with latch without NS. Baby latched well and mom reports it feels fine. Encouraged to nurse without NS as much as possible. Mom has been giving formula also. Encouraged to always breast feed first to promote a good milk supply. No questions at present. To call prn  Patient Name: Alexa Bebe Moncure ZOXWR'U Date: 08/27/2014 Reason for consult: Follow-up assessment   Maternal Data Formula Feeding for Exclusion: Yes Reason for exclusion: Mother's choice to formula and breast feed on admission Has patient been taught Hand Expression?: Yes Does the patient have breastfeeding experience prior to this delivery?: Yes  Feeding Feeding Type: Breast Fed Length of feed: 10 min  LATCH Score/Interventions Latch: Grasps breast easily, tongue down, lips flanged, rhythmical sucking. Intervention(s):  (breast shield.)  Audible Swallowing: A few with stimulation Intervention(s): Alternate breast massage  Type of Nipple: Everted at rest and after stimulation (has been using NS- able to latch without NS)  Comfort (Breast/Nipple): Soft / non-tender     Hold (Positioning): Assistance needed to correctly position infant at breast and maintain latch. Intervention(s): Breastfeeding basics reviewed  LATCH Score: 8  Lactation Tools Discussed/Used Tools: Nipple Shields Nipple shield size: 24 WIC Program: Yes   Consult Status Consult Status: Complete    Pamelia Hoit 08/27/2014, 10:27 AM

## 2014-08-27 NOTE — Progress Notes (Signed)
Post Partum Day 2 Subjective: no complaints, up ad lib, voiding, tolerating PO and nl lochia, pain controlled  Objective: Blood pressure 120/80, pulse 96, temperature 98.1 F (36.7 C), temperature source Oral, resp. rate 18, height  (1.575 m), weight 77.111 kg (170 lb), SpO2 100 %, unknown if currently breastfeeding.  Physical Exam:  General: alert and no distress Lochia: appropriate Uterine Fundus: firm   Recent Labs  08/25/14 1006 08/26/14 0543  HGB 12.1 9.4*  HCT 36.7 29.4*    Assessment/Plan: Discharge home, Breastfeeding and Lactation consult.  Routine care.  D/c with motrin, percocet and PNV.  F/u 6 weeks.     LOS: 2 days   Bovard-Stuckert, Avana Kreiser 08/27/2014, 8:08 AM

## 2014-08-27 NOTE — Discharge Summary (Signed)
Obstetric Discharge Summary Reason for Admission: onset of labor and rupture of membranes Prenatal Procedures: none Intrapartum Procedures: spontaneous vaginal delivery Postpartum Procedures: none Complications-Operative and Postpartum: none HEMOGLOBIN  Date Value Ref Range Status  08/26/2014 9.4* 12.0 - 15.0 g/dL Final    Comment:    REPEATED TO VERIFY DELTA CHECK NOTED   04/07/2014 11.7 g/dL Final   HCT  Date Value Ref Range Status  08/26/2014 29.4* 36.0 - 46.0 % Final  04/07/2014 36 % Final    Physical Exam:  General: alert and no distress Lochia: appropriate Uterine Fundus: firm  Discharge Diagnoses: Term Pregnancy-delivered  Discharge Information: Date: 08/27/2014 Activity: pelvic rest Diet: routine Medications: PNV, Ibuprofen and Percocet Condition: stable Instructions: refer to practice specific booklet Discharge to: home Follow-up Information    Follow up with Oliver Pila, MD. Schedule an appointment as soon as possible for a visit in 6 weeks.   Specialty:  Obstetrics and Gynecology   Why:  Call and verify circumcision appointment   Contact information:   [address] [place] [address] [phone]       Newborn Data: Live born female  Birth Weight: 6 lb 2.8 oz (2800 g) APGAR: 9, 9  Home with mother.  Bovard-Stuckert, Chellsea Beckers 08/27/2014, 8:18 AM

## 2014-09-01 ENCOUNTER — Inpatient Hospital Stay (HOSPITAL_COMMUNITY): Admission: RE | Admit: 2014-09-01 | Payer: Medicaid Other | Source: Ambulatory Visit

## 2015-12-14 ENCOUNTER — Ambulatory Visit (INDEPENDENT_AMBULATORY_CARE_PROVIDER_SITE_OTHER): Payer: Worker's Compensation | Admitting: Physician Assistant

## 2015-12-14 VITALS — BP 124/80 | HR 90 | Temp 98.2°F | Resp 18 | Ht 62.0 in | Wt 172.0 lb

## 2015-12-14 DIAGNOSIS — W503XXA Accidental bite by another person, initial encounter: Secondary | ICD-10-CM | POA: Diagnosis not present

## 2015-12-14 DIAGNOSIS — Y990 Civilian activity done for income or pay: Secondary | ICD-10-CM

## 2015-12-14 DIAGNOSIS — Z7721 Contact with and (suspected) exposure to potentially hazardous body fluids: Secondary | ICD-10-CM | POA: Diagnosis not present

## 2015-12-14 DIAGNOSIS — Z23 Encounter for immunization: Secondary | ICD-10-CM

## 2015-12-14 NOTE — Patient Instructions (Addendum)
  I will contact you with the results.  Please talk to the principal to see if the student and their family is willing to bring them in to have their blood drawn.   IF you received an x-ray today, you will receive an invoice from [facility]. Please contact [facility] at [phone] with questions or concerns regarding your invoice.   IF you received labwork today, you will receive an invoice from [facility]. Please contact [facility] at [phone] with questions or concerns regarding your invoice.   Our billing staff will not be able to assist you with questions regarding bills from these companies.  You will be contacted with the lab results as soon as they are available. The fastest way to get your results is to activate your My Chart account. Instructions are located on the last page of this paperwork. If you have not heard from us regarding the results in 2 weeks, please contact this office.
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
## 2015-12-15 LAB — HEPATITIS C ANTIBODY: HCV Ab: NEGATIVE

## 2015-12-15 LAB — HIV ANTIBODY (ROUTINE TESTING W REFLEX): HIV 1&2 Ab, 4th Generation: NONREACTIVE

## 2015-12-15 LAB — HEPATITIS B SURFACE ANTIGEN: Hepatitis B Surface Ag: NEGATIVE

## 2015-12-15 LAB — HEPATITIS B SURFACE ANTIBODY, QUANTITATIVE

## 2015-12-16 ENCOUNTER — Encounter: Payer: Self-pay | Admitting: Physician Assistant

## 2015-12-18 ENCOUNTER — Encounter: Payer: Self-pay | Admitting: Physician Assistant

## 2015-12-20 NOTE — Telephone Encounter (Signed)
Alexa [name], see also note under 11/9 email.

## 2015-12-20 NOTE — Telephone Encounter (Signed)
THIS MESSAGE IS TO [facility]AH [facility] [employer]:  SHE WANTS SARAH TO KNOW THAT THE STUDENT WILL NOT BE TESTED. TRUE WILL BE HAVING THE BLOOD CHECKS AND HEPATITIS B SERIES. BEST PHONE [phone] - SARAH SMOKE IF QUESTIONS. MBC

## 2015-12-21 NOTE — Telephone Encounter (Signed)
Noted  

## 2015-12-22 ENCOUNTER — Other Ambulatory Visit: Payer: Self-pay

## 2015-12-27 ENCOUNTER — Telehealth: Payer: Self-pay
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
## 2015-12-28 ENCOUNTER — Other Ambulatory Visit: Payer: Self-pay

## 2018-11-15 ENCOUNTER — Other Ambulatory Visit: Payer: Self-pay | Admitting: Obstetrics and Gynecology

## 2018-11-15 DIAGNOSIS — R928 Other abnormal and inconclusive findings on diagnostic imaging of breast: Secondary | ICD-10-CM

## 2018-11-21 ENCOUNTER — Ambulatory Visit
Admission: RE | Admit: 2018-11-21 | Discharge: 2018-11-21 | Disposition: A | Discharge status: Home / Self Care | Payer: BC Managed Care – PPO | Source: Ambulatory Visit | Attending: Obstetrics and Gynecology | Admitting: Obstetrics and Gynecology

## 2018-11-21 ENCOUNTER — Other Ambulatory Visit: Payer: Self-pay | Admitting: Obstetrics and Gynecology

## 2018-11-21 ENCOUNTER — Other Ambulatory Visit: Payer: Self-pay

## 2018-11-21 DIAGNOSIS — R928 Other abnormal and inconclusive findings on diagnostic imaging of breast: Secondary | ICD-10-CM

## 2019-05-15 ENCOUNTER — Ambulatory Visit: Payer: BC Managed Care – PPO | Attending: Family

## 2019-05-15 DIAGNOSIS — Z23 Encounter for immunization: Secondary | ICD-10-CM

## 2019-05-15 NOTE — Progress Notes (Signed)
   Covid-19 Vaccination Clinic  Name:  KASEN SAKO    MRN: [id number] DOB: 1976-01-02  05/15/2019  Ms. Gilpin was observed post Covid-19 immunization for 15 minutes without incident. She was provided with Vaccine Information Sheet and instruction to access the V-Safe system.   Ms. Pha was instructed to call 911 with any severe reactions post vaccine: Marland Kitchen Difficulty breathing  . Swelling of face and throat  . A fast heartbeat  . A bad rash all over body  . Dizziness and weakness   Immunizations Administered    Name Date Dose VIS Date Route   Moderna COVID-19 Vaccine 05/15/2019  3:53 PM 0.5 mL 01/07/2019 Intramuscular   Manufacturer: Moderna   Lot: [id number]   NDC: 28315-176-16

## 2019-05-26 ENCOUNTER — Other Ambulatory Visit: Payer: Self-pay | Admitting: Obstetrics and Gynecology

## 2019-05-26 ENCOUNTER — Other Ambulatory Visit: Payer: Self-pay

## 2019-05-26 ENCOUNTER — Ambulatory Visit
Admission: RE | Admit: 2019-05-26 | Discharge: 2019-05-26 | Disposition: A | Discharge status: Home / Self Care | Payer: BC Managed Care – PPO | Source: Ambulatory Visit | Attending: Obstetrics and Gynecology | Admitting: Obstetrics and Gynecology

## 2019-05-26 DIAGNOSIS — R928 Other abnormal and inconclusive findings on diagnostic imaging of breast: Secondary | ICD-10-CM

## 2019-06-17 ENCOUNTER — Ambulatory Visit: Payer: BC Managed Care – PPO

## 2019-06-19 ENCOUNTER — Ambulatory Visit: Payer: BC Managed Care – PPO | Attending: Family

## 2019-06-19 DIAGNOSIS — Z23 Encounter for immunization: Secondary | ICD-10-CM

## 2019-06-19 NOTE — Progress Notes (Signed)
   Covid-19 Vaccination Clinic  Name:  MEENAKSHI SAZAMA    MRN: [id number] DOB: 1975-12-18  06/19/2019  Ms. Jantz was observed post Covid-19 immunization for 15 minutes without incident. She was provided with Vaccine Information Sheet and instruction to access the V-Safe system.   Ms. Bells was instructed to call 911 with any severe reactions post vaccine: Marland Kitchen Difficulty breathing  . Swelling of face and throat  . A fast heartbeat  . A bad rash all over body  . Dizziness and weakness   Immunizations Administered    Name Date Dose VIS Date Route   Moderna COVID-19 Vaccine 06/19/2019  4:21 PM 0.5 mL 01/2019 Intramuscular   Manufacturer: Moderna   Lot: [id number]   NDC: 42103-128-11

## 2019-11-26 ENCOUNTER — Other Ambulatory Visit: Payer: Self-pay | Admitting: Obstetrics and Gynecology

## 2019-11-26 ENCOUNTER — Ambulatory Visit
Admission: RE | Admit: 2019-11-26 | Discharge: 2019-11-26 | Disposition: A | Discharge status: Home / Self Care | Payer: BC Managed Care – PPO | Source: Ambulatory Visit | Attending: Obstetrics and Gynecology | Admitting: Obstetrics and Gynecology

## 2019-11-26 ENCOUNTER — Other Ambulatory Visit: Payer: Self-pay

## 2019-11-26 DIAGNOSIS — R928 Other abnormal and inconclusive findings on diagnostic imaging of breast: Secondary | ICD-10-CM

## 2020-05-26 ENCOUNTER — Other Ambulatory Visit (HOSPITAL_COMMUNITY): Payer: Self-pay

## 2021-04-29 IMAGING — US US BREAST*L* LIMITED INC AXILLA
1 series · 11 of 11 positions shown · non-contrast
Comparison: Previous exam(s).

CLINICAL DATA: Patient for follow-up of probably benign left breast
mass.

EXAM:
DIGITAL DIAGNOSTIC BILATERAL MAMMOGRAM WITH CAD AND TOMO
ULTRASOUND BILATERAL BREAST

[Series 1: us breast*left* limited inc axilla · 0.07mm/px · 11 of 11 slices shown]
[im 1/11]
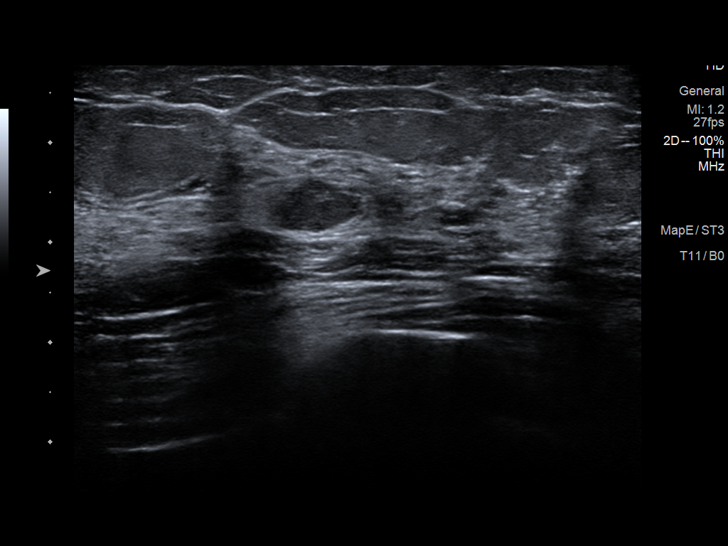
[im 2/11]
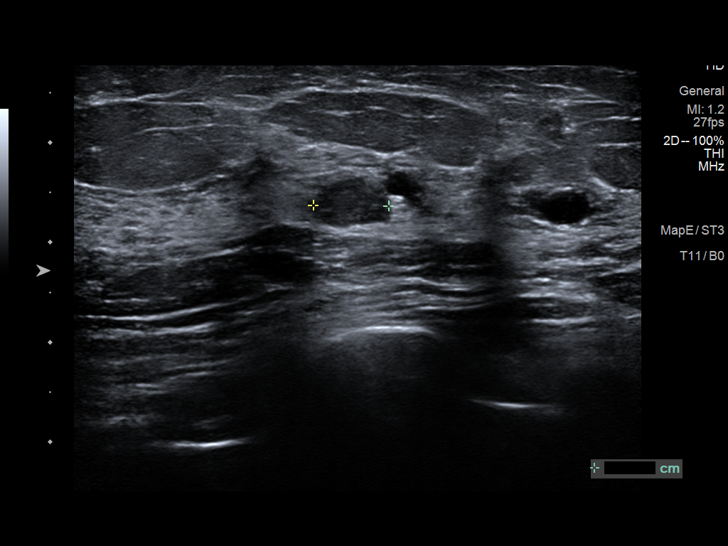
[im 3/11]
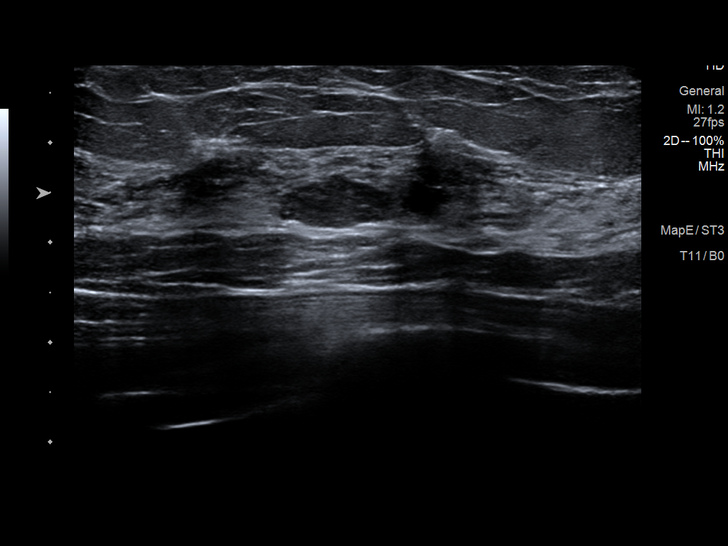
[im 4/11]
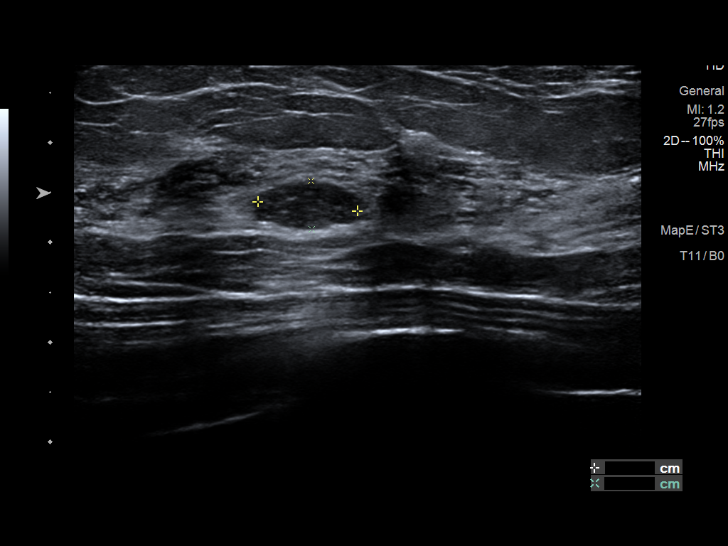
[im 5/11]
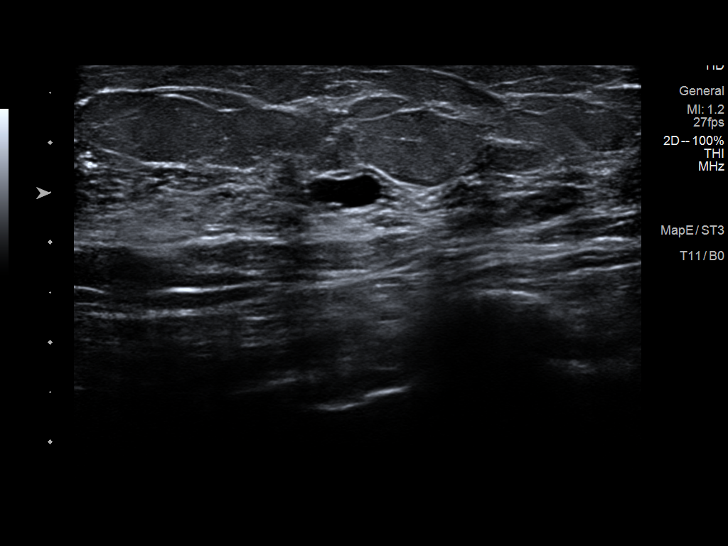
[im 6/11]
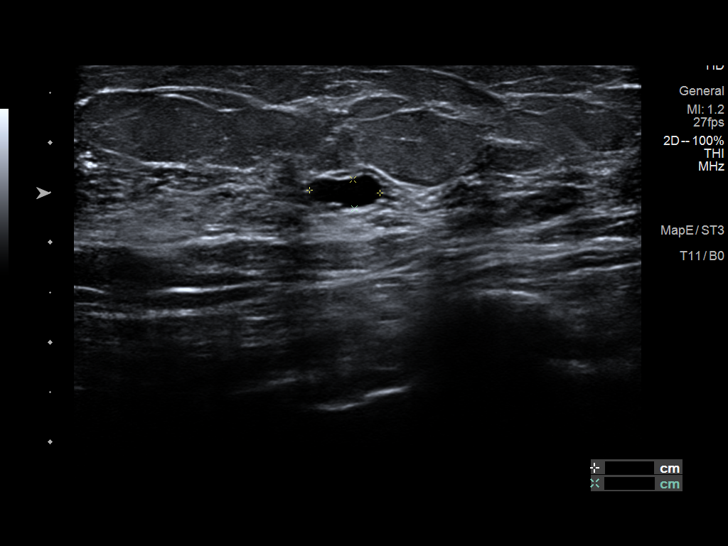
[im 7/11]
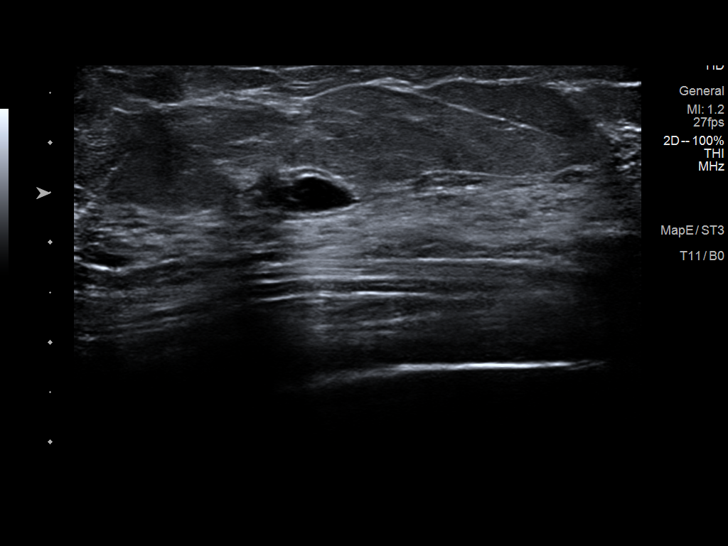
[im 8/11]
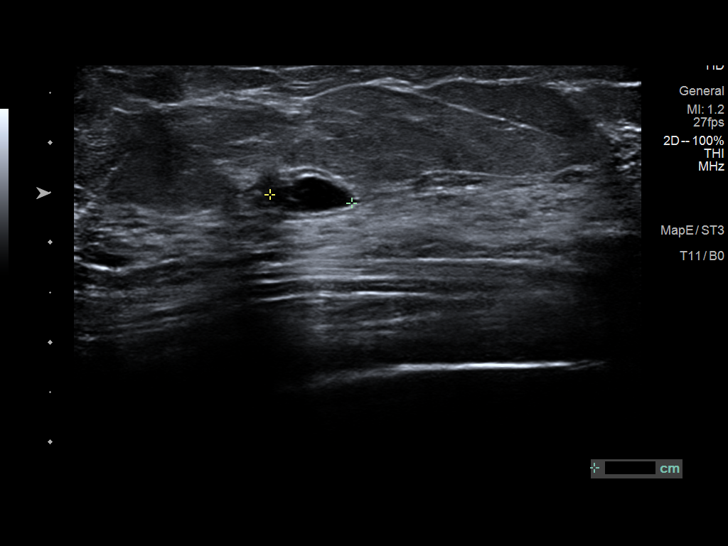
[im 9/11]
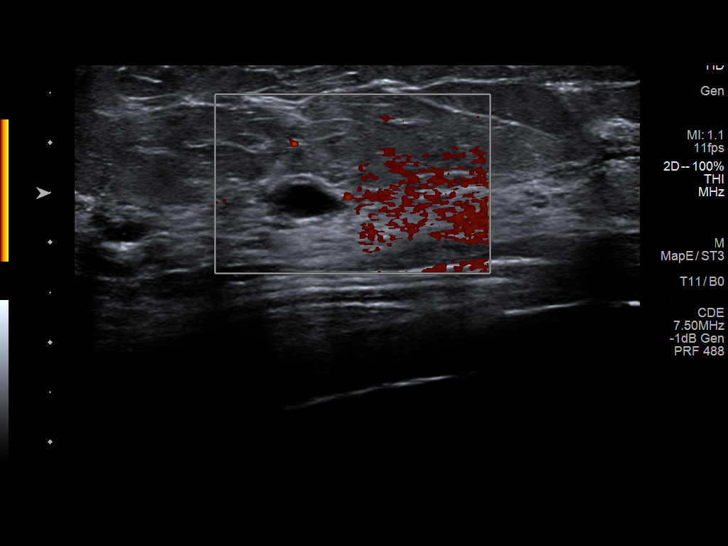
[im 10/11]
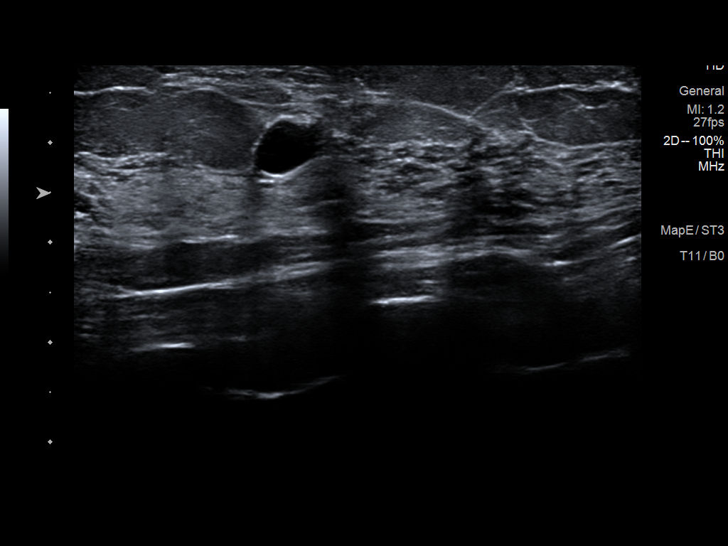
[im 11/11]
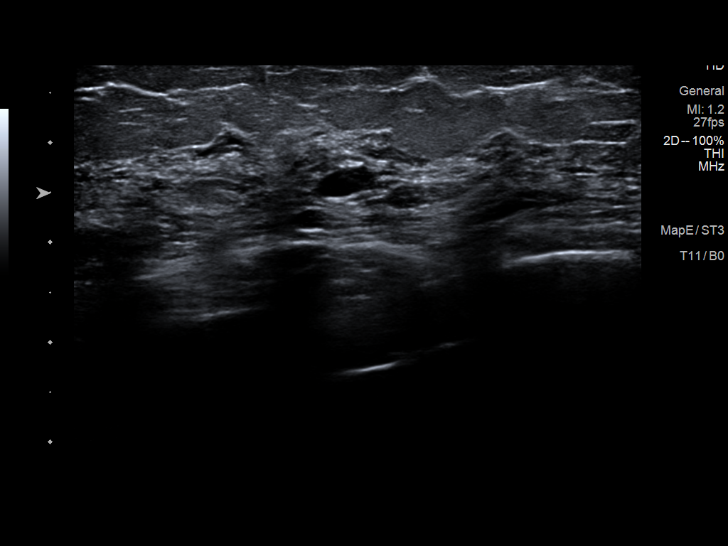

[11 of 11 positions shown; findings below may reference images not displayed]

ACR Breast Density Category c: The breast tissue is heterogeneously
dense, which may obscure small masses.
FINDINGS: Interval development of 2 oval circumscribed masses within the
central aspect of the right breast middle and posterior depth.

Additionally within the outer left breast there appear to be
multiple adjacent partially obscured masses.

The previously visualized mass within the left breast is less
prominent on today's exam.

Mammographic images were processed with CAD.

Targeted ultrasound is performed, showing a stable 1.0 x 0.5 x
cm oval circumscribed hypoechoic mass left breast 12:30 o'clock 4 cm
from nipple.

Multiple simple cysts are demonstrated within the outer left breast.

Within the right breast 12:30 o'clock 5 cm from nipple there is a 7
x 5 x 13 mm cyst.

Within the right breast 1 o'clock position 3 cm from nipple there is
a 0.8 cm cyst. Multiple additional cysts are demonstrated within the
right breast.
IMPRESSION: Stable probably benign left breast mass favored to represent a
fibroadenoma.

Bilateral simple cysts.

RECOMMENDATION:
Bilateral diagnostic mammography and left breast ultrasound in 12
months to demonstrate 2 years of stability of probably benign left
breast mass.

I have discussed the findings and recommendations with the patient.
If applicable, a reminder letter will be sent to the patient
regarding the next appointment.

BI-RADS CATEGORY  3: Probably benign.

## 2021-11-02 ENCOUNTER — Other Ambulatory Visit: Payer: Self-pay | Admitting: Obstetrics and Gynecology

## 2021-11-02 DIAGNOSIS — R928 Other abnormal and inconclusive findings on diagnostic imaging of breast: Secondary | ICD-10-CM

## 2021-12-27 ENCOUNTER — Ambulatory Visit
Admission: RE | Admit: 2021-12-27 | Discharge: 2021-12-27 | Disposition: A | Discharge status: Home / Self Care | Payer: BC Managed Care – PPO | Source: Ambulatory Visit | Attending: Obstetrics and Gynecology | Admitting: Obstetrics and Gynecology

## 2021-12-27 DIAGNOSIS — R928 Other abnormal and inconclusive findings on diagnostic imaging of breast: Secondary | ICD-10-CM

## 2023-03-20 ENCOUNTER — Other Ambulatory Visit: Payer: Self-pay | Admitting: Obstetrics and Gynecology

## 2023-03-20 DIAGNOSIS — N6310 Unspecified lump in the right breast, unspecified quadrant: Secondary | ICD-10-CM

## 2023-03-28 ENCOUNTER — Ambulatory Visit
Admission: RE | Admit: 2023-03-28 | Discharge: 2023-03-28 | Discharge status: Home / Self Care | Payer: Medicaid Other | Source: Ambulatory Visit | Attending: Obstetrics and Gynecology | Admitting: Obstetrics and Gynecology

## 2023-03-28 DIAGNOSIS — N6310 Unspecified lump in the right breast, unspecified quadrant: Secondary | ICD-10-CM

## 2023-03-29 ENCOUNTER — Other Ambulatory Visit: Payer: Medicaid Other
